# Patient Record
Sex: Female | Born: 1980 | Race: White | Hispanic: No | Marital: Single | Smoking: Never smoker
Health system: Southern US, Community
[De-identification: ages and names within clinical notes are randomized; demographics above are authoritative.]

## PROBLEM LIST (undated history)

## (undated) DIAGNOSIS — J302 Other seasonal allergic rhinitis: Secondary | ICD-10-CM

## (undated) DIAGNOSIS — F32A Depression, unspecified: Secondary | ICD-10-CM

## (undated) DIAGNOSIS — F329 Major depressive disorder, single episode, unspecified: Secondary | ICD-10-CM

## (undated) DIAGNOSIS — R51 Headache: Secondary | ICD-10-CM

## (undated) DIAGNOSIS — F419 Anxiety disorder, unspecified: Secondary | ICD-10-CM

## (undated) HISTORY — PX: DIAGNOSTIC LAPAROSCOPY: SUR761

## (undated) HISTORY — PX: WISDOM TOOTH EXTRACTION: SHX21

---

## 2014-02-11 ENCOUNTER — Other Ambulatory Visit (HOSPITAL_COMMUNITY): Payer: Self-pay | Admitting: Obstetrics and Gynecology

## 2014-02-11 NOTE — H&P (Signed)
Danielle Sutton is a 33 y.o. female G 0 for hysterectomy because of endometriosis and chronic pelvic pain. The patient was diagnosed at age 41 with endometriosis that caused her such severe pelvic pain with her menses that she had nausea, vomiting and was incapacitated for about a week.  She was placed on continuous oral contraceptives for ten years with good management of her symptoms.  Once she discontinued her medication, however, her symptoms recurred three months later.  Her menstrual flow lasts 3-5 days with pad change, due to clots, 5 times a day.  Her cramps are  rated at a 10/10 on a 10 point pain scale and she finds no relief with analgesia and only minimal relief with a hot water bottle and adjustments from a chiropractor.  She admits to inter-menstrual spotting-off & on and  dyspareunia  but denies any vaginitis,  urinary tract or bowel symptoms. An endometrial biopsy in January 2015 showed benign secretory endometrium with no hyperplasia or atypia.  A CBC and TSH at that same time was normal.  Her pelvic ultrasound performed in January 2015  showed a uterus: 3.5 x 4.0 x 3.3 cm,  endometrium: 7.17 mm, left ovary: 2.35 x 1.70 x 1.54 cm and right ovary: 3.19 x 1.74 x 2.39 cm.  Length from fundus to external os = 6.4 cm.   A review of both medical and surgical management options was given to the patient however, due to the debilitating nature of her symptoms and the lack of a desire to take oral contraceptives longterm she has decided to proceed with definitive therapy in the form of hysterectomy.   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   ROS: Admits to wearing computer glasses, recent cough and nasal congestion due to a cold  Denies headache, vision changes, dysphagia, tinnitus, dizziness, hoarseness, chest pain, shortness of breath, nausea, vomiting, diarrhea,constipation,  urinary frequency, urgency  dysuria, hematuria, vaginitis symptoms, swelling of joints,easy bruising,  myalgias, arthralgias, skin rashes, unexplained weight loss and except as is mentioned in the history of present illness, patient's review of systems is otherwise negative.  Physical Exam  Bp: 130/88   P: 93  R:16  Temperature: 98.9 degrees F orally    Weight: 150 lbs.  Height: 5'   BMI: 29.3  Neck: supple without masses or thyromegaly Lungs: clear to auscultation Heart: regular rate and rhythm Abdomen: soft, diffusely tender and no organomegaly Pelvic:EGBUS- wnl; vagina-normal rugae but tender; uterus-normal size and tender,  cervix without lesions or motion tenderness; adnexae-bilateral tenderness but no  masses Extremities:  no clubbing, cyanosis or edema   Assesment: Chronic Pelvic Pain            Endometriosis   Disposition:  A discussion was held with patient regarding the indication  for her procedure(s) along with the risks, which include but are not limited to: reaction to anesthesia, damage to adjacent organs, pelvic prolapse, infection and excessive bleeding. The patient verbalized understanding of these risks and has consented to proceed with a Total Abdominal Hysterectomy with Bilateral Salpingectomy,  Cystoscopy, and possible Total Abdominal Hysterectomy at [facility] on March 06, 2014 at 1:30 p.m.   CSN# [id number]   Danielle Sutton J. Tana Trefry, PA-C  for Dr. Angela Y. Roberts   

## 2014-02-17 ENCOUNTER — Encounter (HOSPITAL_COMMUNITY): Payer: Self-pay | Admitting: Pharmacist

## 2014-02-17 ENCOUNTER — Other Ambulatory Visit: Payer: Self-pay | Admitting: Obstetrics and Gynecology

## 2014-02-25 ENCOUNTER — Encounter (HOSPITAL_COMMUNITY)
Admission: RE | Admit: 2014-02-25 | Discharge: 2014-02-25 | Disposition: A | Discharge status: Home / Self Care | Payer: Managed Care, Other (non HMO) | Source: Ambulatory Visit | Attending: Obstetrics and Gynecology | Admitting: Obstetrics and Gynecology

## 2014-02-25 ENCOUNTER — Encounter (HOSPITAL_COMMUNITY): Payer: Self-pay

## 2014-02-25 DIAGNOSIS — Z01812 Encounter for preprocedural laboratory examination: Secondary | ICD-10-CM | POA: Insufficient documentation

## 2014-02-25 HISTORY — DX: Anxiety disorder, unspecified: F41.9

## 2014-02-25 HISTORY — DX: Depression, unspecified: F32.A

## 2014-02-25 HISTORY — DX: Major depressive disorder, single episode, unspecified: F32.9

## 2014-02-25 HISTORY — DX: Other seasonal allergic rhinitis: J30.2

## 2014-02-25 HISTORY — DX: Headache: R51

## 2014-02-25 NOTE — Patient Instructions (Addendum)
   Your procedure is scheduled on:  Thursday, Mar 12  Enter through the [employer] [facility] at:  [place] up the phone at the desk and dial [phone] and inform us of your arrival.  Please call this number if you have any problems the morning of surgery: [phone]  Remember: Do not eat food after midnight: Wednesday Do not drink clear liquids after: 8 am Thursday, day of surgery Take these medicines the morning of surgery with a SIP OF WATER:   None  Do not wear jewelry, make-up, or FINGER nail polish No metal in your hair or on your body. Do not wear lotions, powders, perfumes.  You may wear deodorant.  Do not bring valuables to the hospital. Contacts, dentures or bridgework may not be worn into surgery.  Leave suitcase in the car. After Surgery it may be brought to your room. For patients being admitted to the hospital, checkout time is 11:00am the day of discharge.   Home with parents Mr & Ms Alexa Blish cell [phone] or Vania Rea [phone].

## 2014-03-04 ENCOUNTER — Other Ambulatory Visit: Payer: Self-pay | Admitting: Obstetrics and Gynecology

## 2014-03-06 ENCOUNTER — Encounter (HOSPITAL_COMMUNITY)
Admission: RE | Disposition: A | Discharge status: Home / Self Care | Payer: Self-pay | Source: Ambulatory Visit | Attending: Obstetrics and Gynecology

## 2014-03-06 ENCOUNTER — Encounter (HOSPITAL_COMMUNITY): Payer: Self-pay | Admitting: Certified Registered"

## 2014-03-06 ENCOUNTER — Ambulatory Visit (HOSPITAL_COMMUNITY): Payer: Managed Care, Other (non HMO) | Admitting: Anesthesiology

## 2014-03-06 ENCOUNTER — Ambulatory Visit (HOSPITAL_COMMUNITY): Payer: Managed Care, Other (non HMO)

## 2014-03-06 ENCOUNTER — Encounter (HOSPITAL_COMMUNITY): Payer: Managed Care, Other (non HMO) | Admitting: Anesthesiology

## 2014-03-06 ENCOUNTER — Observation Stay (HOSPITAL_COMMUNITY)
Admission: RE | Admit: 2014-03-06 | Discharge: 2014-03-07 | Disposition: A | Discharge status: Home / Self Care | Payer: Managed Care, Other (non HMO) | Source: Ambulatory Visit | Attending: Obstetrics and Gynecology | Admitting: Obstetrics and Gynecology

## 2014-03-06 DIAGNOSIS — N803 Endometriosis of pelvic peritoneum, unspecified: Principal | ICD-10-CM | POA: Insufficient documentation

## 2014-03-06 DIAGNOSIS — K589 Irritable bowel syndrome without diarrhea: Secondary | ICD-10-CM | POA: Insufficient documentation

## 2014-03-06 DIAGNOSIS — G8929 Other chronic pain: Secondary | ICD-10-CM | POA: Insufficient documentation

## 2014-03-06 DIAGNOSIS — IMO0002 Reserved for concepts with insufficient information to code with codable children: Secondary | ICD-10-CM | POA: Insufficient documentation

## 2014-03-06 DIAGNOSIS — N809 Endometriosis, unspecified: Secondary | ICD-10-CM

## 2014-03-06 DIAGNOSIS — N949 Unspecified condition associated with female genital organs and menstrual cycle: Secondary | ICD-10-CM | POA: Insufficient documentation

## 2014-03-06 DIAGNOSIS — Z90710 Acquired absence of both cervix and uterus: Secondary | ICD-10-CM | POA: Diagnosis present

## 2014-03-06 DIAGNOSIS — D251 Intramural leiomyoma of uterus: Secondary | ICD-10-CM | POA: Insufficient documentation

## 2014-03-06 DIAGNOSIS — D252 Subserosal leiomyoma of uterus: Secondary | ICD-10-CM | POA: Insufficient documentation

## 2014-03-06 HISTORY — PX: LAPAROSCOPIC HYSTERECTOMY: SHX1926

## 2014-03-06 HISTORY — PX: CYSTOSCOPY: SHX5120

## 2014-03-06 LAB — PREGNANCY, URINE: Preg Test, Ur: NEGATIVE

## 2014-03-06 SURGERY — HYSTERECTOMY, TOTAL, LAPAROSCOPIC
Anesthesia: General | Laterality: Bilateral

## 2014-03-06 MED ORDER — NALOXONE HCL 0.4 MG/ML IJ SOLN: 0.4000 mg | INTRAMUSCULAR | Status: DC | PRN

## 2014-03-06 MED ORDER — METHYLENE BLUE 1 % INJ SOLN
INTRAMUSCULAR | Status: AC
  Filled 2014-03-06: qty 10

## 2014-03-06 MED ORDER — HYDROMORPHONE 0.3 MG/ML IV SOLN
INTRAVENOUS | Status: DC
  Administered 2014-03-06: 21:00:00 via INTRAVENOUS
  Administered 2014-03-07: 0.599 mg via INTRAVENOUS
  Filled 2014-03-06: qty 25

## 2014-03-06 MED ORDER — IBUPROFEN 600 MG PO TABS
600.0000 mg | ORAL_TABLET | Freq: Four times a day (QID) | ORAL | Status: DC | PRN
  Administered 2014-03-07 (×2): 600 mg via ORAL
  Filled 2014-03-06 (×2): qty 1

## 2014-03-06 MED ORDER — DIPHENHYDRAMINE HCL 50 MG/ML IJ SOLN
12.5000 mg | Freq: Four times a day (QID) | INTRAMUSCULAR | Status: DC | PRN
  Filled 2014-03-06: qty 1

## 2014-03-06 MED ORDER — FENTANYL CITRATE 0.05 MG/ML IJ SOLN
INTRAMUSCULAR | Status: AC
  Filled 2014-03-06: qty 2

## 2014-03-06 MED ORDER — LACTATED RINGERS IV SOLN
INTRAVENOUS | Status: DC
  Administered 2014-03-07: 01:00:00 via INTRAVENOUS

## 2014-03-06 MED ORDER — ONDANSETRON HCL 4 MG/2ML IJ SOLN
INTRAMUSCULAR | Status: DC | PRN
  Administered 2014-03-06: 4 mg via INTRAVENOUS

## 2014-03-06 MED ORDER — EPHEDRINE SULFATE 50 MG/ML IJ SOLN: INTRAMUSCULAR | Status: DC | PRN

## 2014-03-06 MED ORDER — LIDOCAINE HCL (CARDIAC) 20 MG/ML IV SOLN
INTRAVENOUS | Status: AC
  Filled 2014-03-06: qty 5

## 2014-03-06 MED ORDER — DEXAMETHASONE SODIUM PHOSPHATE 10 MG/ML IJ SOLN
INTRAMUSCULAR | Status: DC | PRN
  Administered 2014-03-06: 10 mg via INTRAVENOUS

## 2014-03-06 MED ORDER — MEPERIDINE HCL 25 MG/ML IJ SOLN: 6.2500 mg | INTRAMUSCULAR | Status: DC | PRN

## 2014-03-06 MED ORDER — DEXAMETHASONE SODIUM PHOSPHATE 10 MG/ML IJ SOLN
INTRAMUSCULAR | Status: AC
  Filled 2014-03-06: qty 1

## 2014-03-06 MED ORDER — CEFAZOLIN SODIUM-DEXTROSE 2-3 GM-% IV SOLR
INTRAVENOUS | Status: AC
  Filled 2014-03-06: qty 50

## 2014-03-06 MED ORDER — NEOSTIGMINE METHYLSULFATE 1 MG/ML IJ SOLN
INTRAMUSCULAR | Status: DC | PRN
  Administered 2014-03-06: 3 mg via INTRAVENOUS

## 2014-03-06 MED ORDER — FENTANYL CITRATE 0.05 MG/ML IJ SOLN
INTRAMUSCULAR | Status: AC
  Filled 2014-03-06: qty 5

## 2014-03-06 MED ORDER — MIDAZOLAM HCL 2 MG/2ML IJ SOLN
INTRAMUSCULAR | Status: AC
  Filled 2014-03-06: qty 2

## 2014-03-06 MED ORDER — GLYCOPYRROLATE 0.2 MG/ML IJ SOLN
INTRAMUSCULAR | Status: AC
  Filled 2014-03-06: qty 3

## 2014-03-06 MED ORDER — MIDAZOLAM HCL 2 MG/2ML IJ SOLN: 0.5000 mg | Freq: Once | INTRAMUSCULAR | Status: DC | PRN

## 2014-03-06 MED ORDER — ACETAMINOPHEN 160 MG/5ML PO SOLN
ORAL | Status: AC
  Administered 2014-03-06: 975 mg via ORAL
  Filled 2014-03-06: qty 40.6

## 2014-03-06 MED ORDER — ONDANSETRON HCL 4 MG/2ML IJ SOLN
INTRAMUSCULAR | Status: AC
  Filled 2014-03-06: qty 2

## 2014-03-06 MED ORDER — PROMETHAZINE HCL 25 MG/ML IJ SOLN
INTRAMUSCULAR | Status: AC
  Filled 2014-03-06: qty 1

## 2014-03-06 MED ORDER — ROCURONIUM BROMIDE 100 MG/10ML IV SOLN
INTRAVENOUS | Status: DC | PRN
  Administered 2014-03-06 (×4): 10 mg via INTRAVENOUS
  Administered 2014-03-06: 30 mg via INTRAVENOUS
  Administered 2014-03-06: 10 mg via INTRAVENOUS

## 2014-03-06 MED ORDER — FENTANYL CITRATE 0.05 MG/ML IJ SOLN
INTRAMUSCULAR | Status: DC | PRN
  Administered 2014-03-06 (×2): 50 ug via INTRAVENOUS
  Administered 2014-03-06: 100 ug via INTRAVENOUS
  Administered 2014-03-06 (×3): 25 ug via INTRAVENOUS
  Administered 2014-03-06 (×6): 50 ug via INTRAVENOUS
  Administered 2014-03-06: 25 ug via INTRAVENOUS

## 2014-03-06 MED ORDER — KETOROLAC TROMETHAMINE 30 MG/ML IJ SOLN: 15.0000 mg | Freq: Once | INTRAMUSCULAR | Status: DC | PRN

## 2014-03-06 MED ORDER — DIPHENHYDRAMINE HCL 12.5 MG/5ML PO ELIX: 12.5000 mg | ORAL_SOLUTION | Freq: Four times a day (QID) | ORAL | Status: DC | PRN

## 2014-03-06 MED ORDER — FENTANYL CITRATE 0.05 MG/ML IJ SOLN
25.0000 ug | INTRAMUSCULAR | Status: DC | PRN
Start: 2014-03-06 — End: 2014-03-06
  Administered 2014-03-06 (×4): 50 ug via INTRAVENOUS

## 2014-03-06 MED ORDER — ACETAMINOPHEN 160 MG/5ML PO SOLN
975.0000 mg | Freq: Once | ORAL | Status: AC
  Administered 2014-03-06: 975 mg via ORAL

## 2014-03-06 MED ORDER — NEOSTIGMINE METHYLSULFATE 1 MG/ML IJ SOLN
INTRAMUSCULAR | Status: AC
  Filled 2014-03-06: qty 1

## 2014-03-06 MED ORDER — ONDANSETRON HCL 4 MG/2ML IJ SOLN: 4.0000 mg | Freq: Four times a day (QID) | INTRAMUSCULAR | Status: DC | PRN

## 2014-03-06 MED ORDER — METHYLENE BLUE 1 % INJ SOLN
INTRAMUSCULAR | Status: DC | PRN
  Administered 2014-03-06: 10 mL via INTRAVENOUS

## 2014-03-06 MED ORDER — PROPOFOL 10 MG/ML IV EMUL
INTRAVENOUS | Status: AC
  Filled 2014-03-06: qty 20

## 2014-03-06 MED ORDER — ROCURONIUM BROMIDE 100 MG/10ML IV SOLN
INTRAVENOUS | Status: AC
  Filled 2014-03-06: qty 1

## 2014-03-06 MED ORDER — KETOROLAC TROMETHAMINE 30 MG/ML IJ SOLN
INTRAMUSCULAR | Status: DC | PRN
  Administered 2014-03-06: 30 mg via INTRAVENOUS

## 2014-03-06 MED ORDER — CEFAZOLIN SODIUM-DEXTROSE 2-3 GM-% IV SOLR
2.0000 g | INTRAVENOUS | Status: AC
  Administered 2014-03-06: 2 g via INTRAVENOUS

## 2014-03-06 MED ORDER — BUPIVACAINE HCL (PF) 0.25 % IJ SOLN
INTRAMUSCULAR | Status: DC | PRN
  Administered 2014-03-06: 19 mL

## 2014-03-06 MED ORDER — KETOROLAC TROMETHAMINE 30 MG/ML IJ SOLN: 30.0000 mg | Freq: Four times a day (QID) | INTRAMUSCULAR | Status: DC

## 2014-03-06 MED ORDER — PROMETHAZINE HCL 25 MG/ML IJ SOLN
6.2500 mg | INTRAMUSCULAR | Status: DC | PRN
  Administered 2014-03-06: 6.25 mg via INTRAVENOUS

## 2014-03-06 MED ORDER — KETOROLAC TROMETHAMINE 30 MG/ML IJ SOLN
INTRAMUSCULAR | Status: AC
Start: 2014-03-06 — End: 2014-03-06
  Filled 2014-03-06: qty 1

## 2014-03-06 MED ORDER — SODIUM CHLORIDE 0.9 % IJ SOLN: 9.0000 mL | INTRAMUSCULAR | Status: DC | PRN

## 2014-03-06 MED ORDER — MIDAZOLAM HCL 2 MG/2ML IJ SOLN
INTRAMUSCULAR | Status: DC | PRN
  Administered 2014-03-06 (×2): 2 mg via INTRAVENOUS

## 2014-03-06 MED ORDER — OXYCODONE-ACETAMINOPHEN 5-325 MG PO TABS
1.0000 | ORAL_TABLET | ORAL | Status: DC | PRN
  Administered 2014-03-07: 1 via ORAL
  Filled 2014-03-06: qty 2
  Filled 2014-03-06: qty 1

## 2014-03-06 MED ORDER — BUPIVACAINE HCL (PF) 0.25 % IJ SOLN
INTRAMUSCULAR | Status: AC
  Filled 2014-03-06: qty 30

## 2014-03-06 MED ORDER — LACTATED RINGERS IV SOLN
INTRAVENOUS | Status: DC
  Administered 2014-03-06 (×4): via INTRAVENOUS

## 2014-03-06 SURGICAL SUPPLY — 55 items
BARRIER ADHS 3X4 INTERCEED (GAUZE/BANDAGES/DRESSINGS) IMPLANT
CANISTER SUCT 3000ML (MISCELLANEOUS) ×3 IMPLANT
CHLORAPREP W/TINT 26ML (MISCELLANEOUS) ×3 IMPLANT
CLOSURE WOUND 1/4X4 (GAUZE/BANDAGES/DRESSINGS)
CLOTH BEACON ORANGE TIMEOUT ST (SAFETY) ×3 IMPLANT
COVER MAYO STAND STRL (DRAPES) ×3 IMPLANT
DERMABOND ADHESIVE PROPEN (GAUZE/BANDAGES/DRESSINGS) ×4
DERMABOND ADVANCED (GAUZE/BANDAGES/DRESSINGS) ×2
DERMABOND ADVANCED .7 DNX12 (GAUZE/BANDAGES/DRESSINGS) ×1 IMPLANT
DERMABOND ADVANCED .7 DNX6 (GAUZE/BANDAGES/DRESSINGS) ×2 IMPLANT
DISSECTOR BLUNT TIP ENDO 5MM (MISCELLANEOUS) IMPLANT
DRAPE HYSTEROSCOPY (DRAPE) ×3 IMPLANT
EVACUATOR SMOKE 8.L (FILTER) ×6 IMPLANT
GLOVE BIO SURGEON STRL SZ7.5 (GLOVE) ×3 IMPLANT
GLOVE BIOGEL PI IND STRL 7.5 (GLOVE) ×2 IMPLANT
GLOVE BIOGEL PI INDICATOR 7.5 (GLOVE) ×4
GOWN STRL REUS W/ TWL XL LVL3 (GOWN DISPOSABLE) ×1 IMPLANT
GOWN STRL REUS W/TWL LRG LVL3 (GOWN DISPOSABLE) ×9 IMPLANT
GOWN STRL REUS W/TWL XL LVL3 (GOWN DISPOSABLE) ×2
HEMOSTAT SURGICEL 2X14 (HEMOSTASIS) IMPLANT
NEEDLE INSUFFLATION 120MM (ENDOMECHANICALS) ×3 IMPLANT
NS IRRIG 1000ML POUR BTL (IV SOLUTION) ×3 IMPLANT
OCCLUDER COLPOPNEUMO (BALLOONS) ×3 IMPLANT
PACK LAPAROSCOPY BASIN (CUSTOM PROCEDURE TRAY) ×3 IMPLANT
PACK VAGINAL WOMENS (CUSTOM PROCEDURE TRAY) ×3 IMPLANT
SCALPEL HARMONIC ACE (MISCELLANEOUS) ×6 IMPLANT
SCISSORS LAP 5X35 DISP (ENDOMECHANICALS) IMPLANT
SET CYSTO W/LG BORE CLAMP LF (SET/KITS/TRAYS/PACK) ×3 IMPLANT
SET IRRIG TUBING LAPAROSCOPIC (IRRIGATION / IRRIGATOR) ×3 IMPLANT
SOLUTION ELECTROLUBE (MISCELLANEOUS) IMPLANT
SPONGE GAUZE 4X4 12PLY (GAUZE/BANDAGES/DRESSINGS) ×12 IMPLANT
SPONGE SURGIFOAM ABS GEL 12-7 (HEMOSTASIS) ×3 IMPLANT
STRIP CLOSURE SKIN 1/4X4 (GAUZE/BANDAGES/DRESSINGS) IMPLANT
SUT MNCRL AB 3-0 PS2 27 (SUTURE) ×9 IMPLANT
SUT PDS AB 1 CT1 36 (SUTURE) ×24 IMPLANT
SUT VIC AB 0 CT1 27 (SUTURE) ×8
SUT VIC AB 0 CT1 27XBRD ANBCTR (SUTURE) ×4 IMPLANT
SUT VICRYL 0 TIES 12 18 (SUTURE) IMPLANT
SUT VICRYL 0 UR6 27IN ABS (SUTURE) ×6 IMPLANT
SYR 50ML LL SCALE MARK (SYRINGE) ×3 IMPLANT
TIP UTERINE 5.1X6CM LAV DISP (MISCELLANEOUS) ×3 IMPLANT
TIP UTERINE 6.7X10CM GRN DISP (MISCELLANEOUS) IMPLANT
TIP UTERINE 6.7X6CM WHT DISP (MISCELLANEOUS) IMPLANT
TIP UTERINE 6.7X8CM BLUE DISP (MISCELLANEOUS) IMPLANT
TIP UTERINE MANIPULATOR 3.75CM (MISCELLANEOUS) ×3 IMPLANT
TOWEL OR 17X24 6PK STRL BLUE (TOWEL DISPOSABLE) ×6 IMPLANT
TRAY FOLEY CATH 14FR (SET/KITS/TRAYS/PACK) ×3 IMPLANT
TROCAR BALLN 12MMX100 BLUNT (TROCAR) ×3 IMPLANT
TROCAR XCEL NON-BLD 11X100MML (ENDOMECHANICALS) ×6 IMPLANT
TROCAR XCEL NON-BLD 5MMX100MML (ENDOMECHANICALS) ×3 IMPLANT
TROCAR XCEL OPT SLVE 5M 100M (ENDOMECHANICALS) ×6 IMPLANT
TUBING FILTER THERMOFLATOR (ELECTROSURGICAL) ×3 IMPLANT
WARMER LAPAROSCOPE (MISCELLANEOUS) ×3 IMPLANT
WATER STERILE IRR 1000ML POUR (IV SOLUTION) ×3 IMPLANT
YANKAUER SUCT BULB TIP NO VENT (SUCTIONS) ×6 IMPLANT

## 2014-03-06 NOTE — Anesthesia Preprocedure Evaluation (Addendum)
Anesthesia Evaluation  Patient identified by MRN, date of birth, ID band Patient awake    Reviewed: Allergy & Precautions, H&P , Patient's Chart, lab work & pertinent test results, reviewed documented beta blocker date and time   History of Anesthesia Complications Negative for: history of anesthetic complications  Airway Mallampati: II TM Distance: >3 FB Neck ROM: full    Dental   Pulmonary  breath sounds clear to auscultation        Cardiovascular Exercise Tolerance: Good Rhythm:regular Rate:Normal     Neuro/Psych    GI/Hepatic   Endo/Other    Renal/GU      Musculoskeletal   Abdominal   Peds  Hematology   Anesthesia Other Findings   Reproductive/Obstetrics                           Anesthesia Physical Anesthesia Plan  ASA: II  Anesthesia Plan: General ETT   Post-op Pain Management:    Induction:   Airway Management Planned:   Additional Equipment:   Intra-op Plan:   Post-operative Plan:   Informed Consent: I have reviewed the patients History and Physical, chart, labs and discussed the procedure including the risks, benefits and alternatives for the proposed anesthesia with the patient or authorized representative who has indicated his/her understanding and acceptance.   Dental Advisory Given  Plan Discussed with: CRNA and Surgeon  Anesthesia Plan Comments:        mask induction Anesthesia Quick Evaluation

## 2014-03-06 NOTE — Transfer of Care (Signed)
Immediate Anesthesia Transfer of Care Note  Patient: Danielle Sutton  Procedure(s) Performed: Procedure(s) with comments: HYSTERECTOMY TOTAL LAPAROSCOPIC, Bilateral Salpingectomy (Bilateral) - TLH/Cystoscopy/Bilateral Salpingectomy/poss LAVH/poss LAVH/poss TAH  CYSTOSCOPY (Bilateral)  Patient Location: PACU  Anesthesia Type:General  Level of Consciousness: awake  Airway & Oxygen Therapy: Patient Spontanous Breathing  Post-op Assessment: Report given to PACU RN  Post vital signs: stable  Filed Vitals:   03/06/14 1208  BP: 148/88  Pulse: 75  Temp: 36.6 C  Resp: 18    Complications: No apparent anesthesia complications

## 2014-03-06 NOTE — Anesthesia Postprocedure Evaluation (Signed)
  Anesthesia Post-op Note  Patient: Danielle Sutton  Procedure(s) Performed: Procedure(s) with comments: HYSTERECTOMY TOTAL LAPAROSCOPIC, Bilateral Salpingectomy (Bilateral) - TLH/Cystoscopy/Bilateral Salpingectomy/poss LAVH/poss LAVH/poss TAH  CYSTOSCOPY (Bilateral)  Patient Location: PACU  Anesthesia Type:General  Level of Consciousness: awake, alert  and oriented  Airway and Oxygen Therapy: Patient Spontanous Breathing  Post-op Pain: mild  Post-op Assessment: Post-op Vital signs reviewed, Patient's Cardiovascular Status Stable, Respiratory Function Stable, Patent Airway and Pain level controlled  Post-op Vital Signs: Reviewed and stable  Complications: No apparent anesthesia complications

## 2014-03-06 NOTE — Interval H&P Note (Signed)
History and Physical Interval Note:  03/06/2014 1:44 PM  Danielle Sutton  has presented today for surgery, with the diagnosis of Endometriosis, Dysparunia, Pelvic Pain, Abdominal Pain  The various methods of treatment have been discussed with the patient and family. After consideration of risks, benefits and other options for treatment, the patient has consented to  Procedure(s) with comments: HYSTERECTOMY TOTAL LAPAROSCOPIC, Bilateral Salpingectomy (Bilateral) - TLH/Cystoscopy/Bilateral Salpingectomy/poss LAVH/poss LAVH/poss TAH  CYSTOSCOPY (Bilateral) as a surgical intervention .  The patient's history has been reviewed, patient examined, no change in status, stable for surgery.  I have reviewed the patient's chart and labs.  Questions were answered to the patient's satisfaction.  The H&P by EP means to say TLH.   Delice Lesch

## 2014-03-06 NOTE — Op Note (Addendum)
Preop Diagnosis: Endometriosis, Dysparunia, Pelvic Pain, Abdominal Pain   Postop Diagnosis: Endometriosis, Dysparunia, Pelvic Pain, Abdominal Pain   Procedure: HYSTERECTOMY TOTAL LAPAROSCOPIC BILATERAL SALPINGECTOMY CYSTOSCOPY   Anesthesia: General   Anesthesiologist: Rudean Curt, MD   Attending: Delice Lesch, MD   Assistant: Crawford Givens, MD  Findings: + endometriotic implants in post cul de sac. No significant adhesions. Normal bilateral ovaries and tubes.  Pathology: Uterus and Cervix (less than 250g)  Fluids: See flowsheet  UOP: See flowsheet  EBL: See flowsheet  Complications: None  Procedure: The patient was taken to the operating room, placed under general anesthesia and prepped and draped in the normal sterile fashion. A Foley catheter was placed in the bladder. The uterus sounded to 7.  A weighted speculum and vaginal retractors were placed in the vagina.  Tenaculum was placed on the anterior lip of the cervix.  A size 6 cm tip was used and the rumi was placed, tip balloon and occluder insufflated.  It was very difficult to place smallest (size 3.0 cm) KOH ring on cervix because pelvic bone was very narow.   Attention was then turned to the abdomen. A 10 mm infraumbilical incision was made with the scalpel after 5 cc of 0.25% percent Marcaine was used for local anesthesia. The Veress needle was placed in the intra-abdominal cavity and insufflation obtained.  A 10 mm trochar was advanced into the intra-abdominal cavity and the laparoscope was introduced.  Two 5 mm trochars were placed in the right and left lower quadrants under direct visualization with the laparoscope and a 10 mm trochar was placed in the suprapubic area under direct visualization as well.  The right fallopian tube was excised with the harmonic.  The harmonic scalpel was then used to cauterize and cut the uterine ovarian ligament on the right as well as the round ligament.  The same was done on the  contralateral side.  The uterine artery on the right was cauterized and cut with harmonic scalpel.  The harmonic was then used to circumscribe the [facility] ring and free the uterus and cervix on the right. The same was done on the contralateral side.  The uterus was then pulled into the vagina.  Both angles were sutured with 1 PDS.  The  remainder of the cuff was sutured with 1 PDS using interrupted stitches until the vaginal cuff was closed.  A total of approximately 6 sutures were used.   Irrigation was performed.  Gas was allowed to leave the abdomen to check for bleeders and all pedicles were seen to be hemostatic the patient was given methylene.  The vagina was inspected and the cuff was noted to be intact.  Cystoscopy was performed and both ureters were seen to efflux methylene blue without difficulty. The bladder had full integrity with no suture or laceration visualized.  Attention was then turned back to the abdomen after removing top pair of gloves. The abdomen was reinsufflated with CO2 gas.   The abdomen and pelvis was copiously irrigated and  hemostasis was noted.  All trochars were removed under direct visualization using the laparoscope.  The two 29mm fascial incisions were reapproximated with 0 vicryl. The two 10 mm incisions were closed with 3-0 Monocryl via a subcuticular stitch.  All remaining skin incisions were closed with Dermabond and the 10 mm skin incisions were reinforced using Dermabond.  Sponge lap and needle counts were correct.  The patient tolerated the procedure well and was returned to the PACU in stable  condition.  Incidentally a second harmonic needed to be used because after incising part of the round some blue mesh (very small amount) like material was noted at the site.  It was removed as much as possible but it was not certain from where it had originated and whether it was from the device so it was changed out as a precaution.

## 2014-03-06 NOTE — Preoperative (Signed)
Beta Blockers   Reason not to administer Beta Blockers:Not Applicable 

## 2014-03-06 NOTE — H&P (View-Only) (Signed)
Danielle Sutton is a 33 y.o. female G 0 for hysterectomy because of endometriosis and chronic pelvic pain. The patient was diagnosed at age 41 with endometriosis that caused her such severe pelvic pain with her menses that she had nausea, vomiting and was incapacitated for about a week.  She was placed on continuous oral contraceptives for ten years with good management of her symptoms.  Once she discontinued her medication, however, her symptoms recurred three months later.  Her menstrual flow lasts 3-5 days with pad change, due to clots, 5 times a day.  Her cramps are  rated at a 10/10 on a 10 point pain scale and she finds no relief with analgesia and only minimal relief with a hot water bottle and adjustments from a chiropractor.  She admits to inter-menstrual spotting-off & on and  dyspareunia  but denies any vaginitis,  urinary tract or bowel symptoms. An endometrial biopsy in January 2015 showed benign secretory endometrium with no hyperplasia or atypia.  A CBC and TSH at that same time was normal.  Her pelvic ultrasound performed in January 2015  showed a uterus: 3.5 x 4.0 x 3.3 cm,  endometrium: 7.17 mm, left ovary: 2.35 x 1.70 x 1.54 cm and right ovary: 3.19 x 1.74 x 2.39 cm.  Length from fundus to external os = 6.4 cm.   A review of both medical and surgical management options was given to the patient however, due to the debilitating nature of her symptoms and the lack of a desire to take oral contraceptives longterm she has decided to proceed with definitive therapy in the form of hysterectomy.   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   ROS: Admits to wearing computer glasses, recent cough and nasal congestion due to a cold  Denies headache, vision changes, dysphagia, tinnitus, dizziness, hoarseness, chest pain, shortness of breath, nausea, vomiting, diarrhea,constipation,  urinary frequency, urgency  dysuria, hematuria, vaginitis symptoms, swelling of joints,easy bruising,  myalgias, arthralgias, skin rashes, unexplained weight loss and except as is mentioned in the history of present illness, patient's review of systems is otherwise negative.  Physical Exam  Bp: 130/88   P: 93  R:16  Temperature: 98.9 degrees F orally    Weight: 150 lbs.  Height: 5'   BMI: 29.3  Neck: supple without masses or thyromegaly Lungs: clear to auscultation Heart: regular rate and rhythm Abdomen: soft, diffusely tender and no organomegaly Pelvic:EGBUS- wnl; vagina-normal rugae but tender; uterus-normal size and tender,  cervix without lesions or motion tenderness; adnexae-bilateral tenderness but no  masses Extremities:  no clubbing, cyanosis or edema   Assesment: Chronic Pelvic Pain            Endometriosis   Disposition:  A discussion was held with patient regarding the indication  for her procedure(s) along with the risks, which include but are not limited to: reaction to anesthesia, damage to adjacent organs, pelvic prolapse, infection and excessive bleeding. The patient verbalized understanding of these risks and has consented to proceed with a Total Abdominal Hysterectomy with Bilateral Salpingectomy,  Cystoscopy, and possible Total Abdominal Hysterectomy at [facility] on March 06, 2014 at 1:30 p.m.   CSN# [id number]   Terressa Evola J. Florene Glen, PA-C  for Dr. Harvie Bridge. Mancel Bale

## 2014-03-07 ENCOUNTER — Encounter (HOSPITAL_COMMUNITY): Payer: Self-pay | Admitting: Obstetrics and Gynecology

## 2014-03-07 LAB — BASIC METABOLIC PANEL
BUN: 14 mg/dL (ref 6–23)
CALCIUM: 8.2 mg/dL — AB (ref 8.4–10.5)
CO2: 23 mEq/L (ref 19–32)
Chloride: 107 mEq/L (ref 96–112)
Creatinine, Ser: 0.77 mg/dL (ref 0.50–1.10)
GFR calc Af Amer: >90 mL/min (ref >90)
GFR calc non Af Amer: >90 mL/min (ref >90)
Glucose, Bld: 181 mg/dL — ABNORMAL HIGH (ref 70–99)
POTASSIUM: 4.2 meq/L (ref 3.7–5.3)
SODIUM: 140 meq/L (ref 137–147)

## 2014-03-07 LAB — CBC
HCT: 32.1 % — ABNORMAL LOW (ref 36.0–46.0)
Hemoglobin: 11.1 g/dL — ABNORMAL LOW (ref 12.0–15.0)
MCH: 32.5 pg (ref 26.0–34.0)
MCHC: 34.6 g/dL (ref 30.0–36.0)
MCV: 93.9 fL (ref 78.0–100.0)
PLATELETS: 185 10*3/uL (ref 150–400)
RBC: 3.42 MIL/uL — ABNORMAL LOW (ref 3.87–5.11)
RDW: 12.1 % (ref 11.5–15.5)
WBC: 10.8 10*3/uL — AB (ref 4.0–10.5)

## 2014-03-07 NOTE — Addendum Note (Signed)
Addendum created 03/07/14 0827 by Billie Lade, CRNA   Modules edited: Notes Section   Notes Section:  File: [id number]

## 2014-03-07 NOTE — Progress Notes (Signed)
Pt discharged to home with parents.  Condition stable.  Pt ambulated to car with L. Graylon Good, NT.  No equipment for home ordered at discharge.

## 2014-03-07 NOTE — Discharge Summary (Signed)
Physician Discharge Summary  Patient ID: Danielle Sutton MRN: [id number] DOB/AGE: [birth year] 33 y.o.  Admit date: 03/06/2014 Discharge date: 03/07/2014   Discharge Diagnoses: Chronic Pelvic Pain and Endometriosis Active Problems:   S/P laparoscopic hysterectomy   Operation: Total Laparoscopic Hysterectomy, Bilateral Salpingectomy and Cystoscopy  Discharged Condition: Good  Hospital Course: On the date of admission the patient underwent the aforementioned procedures and tolerated them well.  Post operative course was unremarkable with the patient resuming bowel and bladder function by post operative day #1 and was therefore deemed ready for discharge home.  Discharge hemoglobin was 11.1.  Disposition: Discharge Home to Self Care  Discharge Medications:    Medication List    STOP taking these medications       amoxicillin-clavulanate 875-125 MG per tablet  Commonly known as:  AUGMENTIN      TAKE these medications       CALCIUM MAGNESIUM PO  Take 1,200 mg by mouth daily. 1200 mg calcium     ibuprofen 600 MG tablet  Commonly known as:  ADVIL,MOTRIN  Take 1 tablet (600 mg total) by mouth every 6 (six) hours as needed. pc     multivitamin with minerals tablet  Take 1 tablet by mouth daily.     ROXICET 5-325 MG per tablet  Generic drug:  oxyCODONE-acetaminophen  Take 1 tablet by mouth every 4 (four) hours as needed for severe pain.     SELENIUM PO  Take 1 tablet by mouth daily.     Vitamin D-3 5000 UNITS Tabs  Take 1 tablet by mouth daily.     VITAMIN LIQUID PO  Take 1 Units by mouth daily.         Follow-up: Dr. Mancel Bale,  April 10, 2014 at 9:30 a.m.   SignedEarnstine Regal, PA-C 03/07/2014, 8:13 AM

## 2014-03-07 NOTE — Progress Notes (Signed)
Danielle Sutton is a37 y.o.  [id number]  Post Op Date # 1;  TLH/BS/Cystoscopy Subjective: Patient is Doing well postoperatively. Patient has The patient is not having any pain., is ambulating in halls without difficulty, tolerating regular diet and anxious to have Foley removed and to go home.   Objective: Vital signs in last 24 hours: Temp:  [97.4 F (36.3 C)-98.6 F (37 C)] 98.2 F (36.8 C) (03/13 0518) Pulse Rate:  [67-94] 87 (03/13 0518) Resp:  [17-30] 18 (03/13 0518) BP: (100-148)/(60-88) 100/60 mmHg (03/13 0518) SpO2:  [93 %-100 %] 98 % (03/13 0518) Weight:  [145 lb (65.772 kg)] 145 lb (65.772 kg) (03/12 2134)  Intake/Output from previous day: 03/12 0701 - 03/13 0700 In: 5158.9 [P.O.:680; I.V.:4478.9] Out: 2400 [Urine:2325] Intake/Output this shift:    Recent Labs Lab 03/07/14 0518  WBC 10.8*  HGB 11.1*  HCT 32.1*  PLT 185     Recent Labs Lab 03/07/14 0518  NA 140  K 4.2  CL 107  CO2 23  BUN 14  CREATININE 0.77  CALCIUM 8.2*  GLUCOSE 181*    EXAM: General: alert, cooperative and no distress Resp: clear to auscultation bilaterally Cardio: regular rate and rhythm, S1, S2 normal, no murmur, click, rub or gallop GI: Soft, bowel sounds present, all incisions intact without evidence of infection. Extremities: Homans sign is negative, no sign of DVT and no calf tenderness. Vaginal Bleeding: none   Assessment: s/p Procedure(s): HYSTERECTOMY TOTAL LAPAROSCOPIC, Bilateral Salpingectomy CYSTOSCOPY: stable, progressing well and tolerating diet  Plan: Discharge home Discontinue Foley and if voids without difficulty will proceed with discharge.  LOS: 1 day    Bernetha Anschutz, PA-C 03/07/2014 7:50 AM

## 2014-03-07 NOTE — Anesthesia Postprocedure Evaluation (Signed)
  Anesthesia Post-op Note  Anesthesia Post Note  Patient: Danielle Sutton  Procedure(s) Performed: Procedure(s) (LRB): HYSTERECTOMY TOTAL LAPAROSCOPIC, Bilateral Salpingectomy (Bilateral) CYSTOSCOPY (Bilateral)  Anesthesia type: General  Patient location: [facility]  Post pain: Pain level controlled  Post assessment: Post-op Vital signs reviewed  Last Vitals:  Filed Vitals:   03/07/14 0518  BP: 100/60  Pulse: 87  Temp: 36.8 C  Resp: 18    Post vital signs: Reviewed  Level of consciousness: sedated  Complications: No apparent anesthesia complications

## 2014-03-07 NOTE — Discharge Instructions (Signed)
Call [facility] @ [phone] if:  You have a temperature greater than or equal to 100.4 degrees Farenheit orally You have pain that is not made better by the pain medication given and taken as directed You have excessive bleeding or problems urinating  Take Colace (Docusate Sodium/Stool Softener) 100 mg 2-4  times daily while taking narcotic pain medicine to avoid constipation or until bowel movements are regular. Take Ibuprofen 600 mg with food, every 6 hours for 5 days then as needed for pain.  You may drive after 1 week You may walk up steps You may shower  You may resume a regular diet Keep incisions clean and dry Do not lift over 15 pounds for 6 weeks Avoid anything in vagina for 6 weeks (or until after your post-operative visit)  Keep follow up appointment with Danielle Sutton on April 10, 2014 at 9:30 a.m.

## 2015-05-10 IMAGING — CR DG ABD PORTABLE 1V
1 series · 1 of 1 positions shown · non-contrast
Comparison: No priors.

CLINICAL DATA: Status post laparoscopic hysterectomy. Needle count
short by 1 needle. Evaluate for potential foreign body.

EXAM:
PORTABLE ABDOMEN - 1 VIEW

[view not recorded]
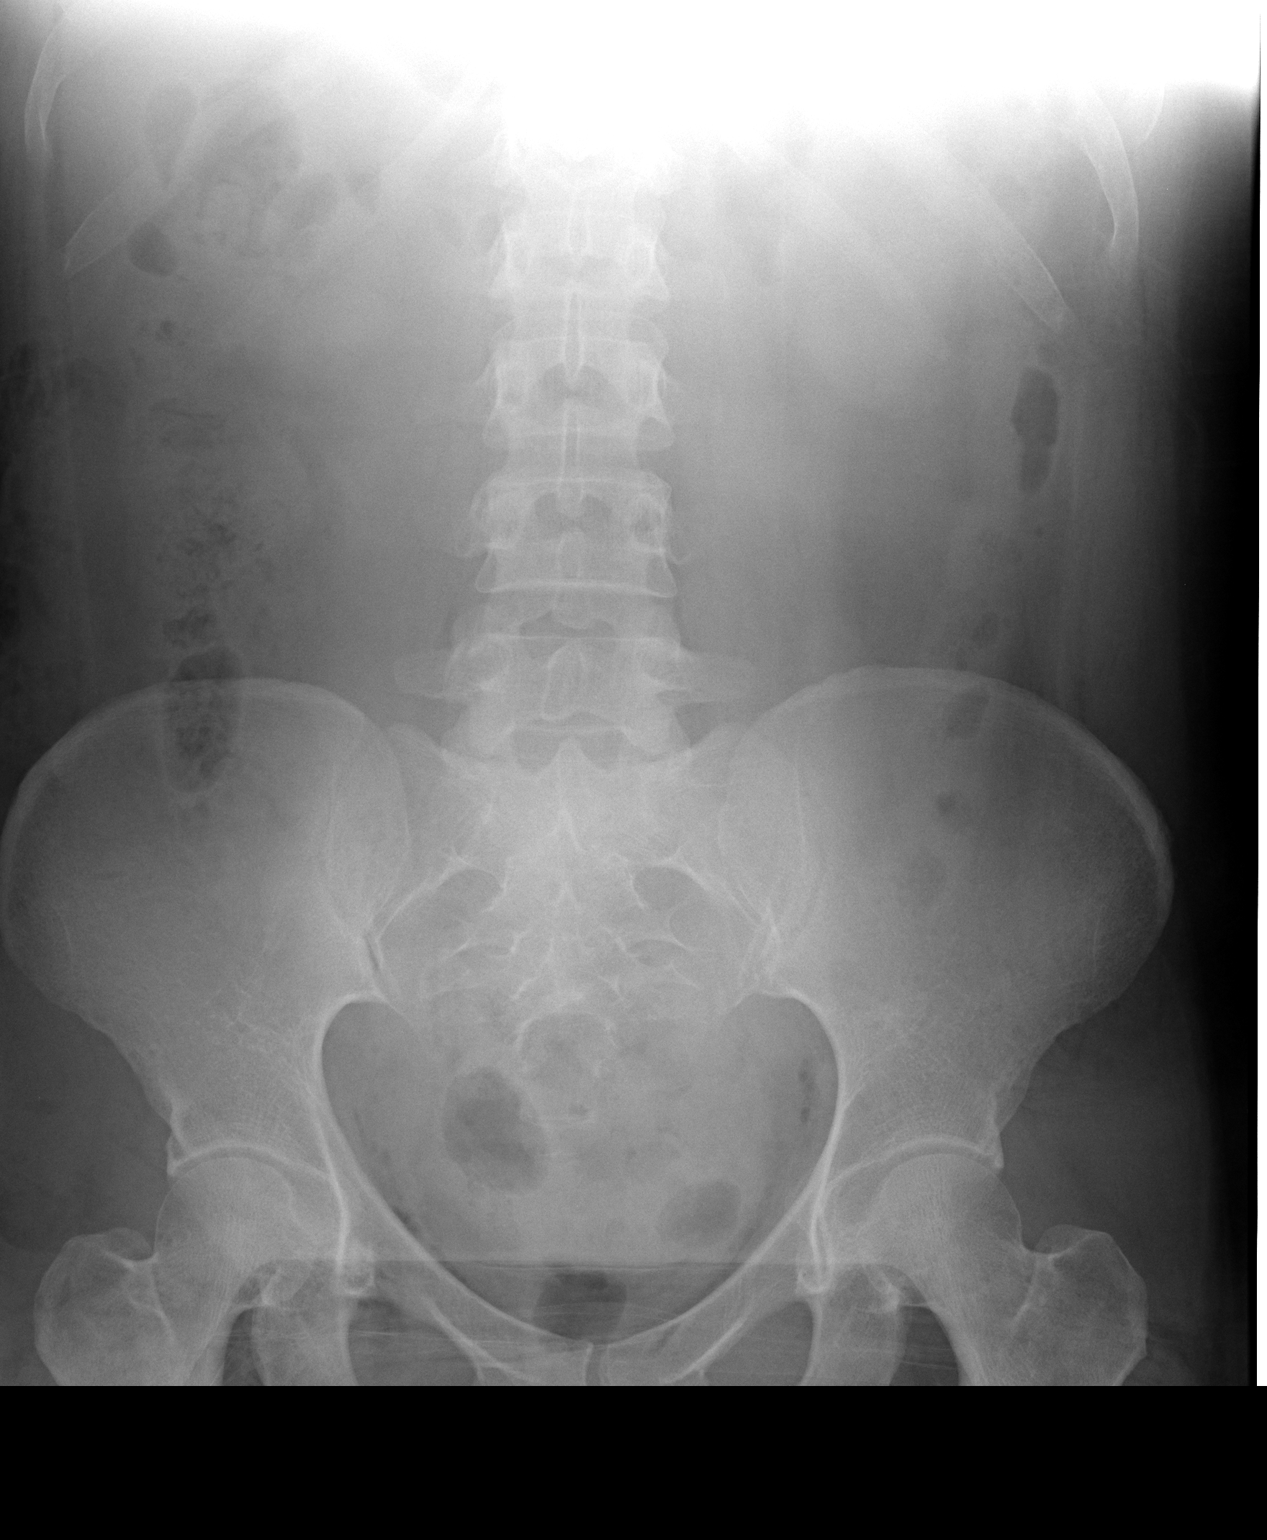

[1 of 1 positions shown; findings below may reference images not displayed]

FINDINGS: Single AP view of the abdomen and pelvis demonstrates no unexpected
radiopaque foreign body. Bowel gas pattern is nonobstructive.
IMPRESSION: 1. No unexpected radiopaque foreign body to suggest a retained
needle.

## 2015-06-08 ENCOUNTER — Other Ambulatory Visit: Payer: Self-pay | Admitting: Obstetrics and Gynecology
